# Patient Record
Sex: Female | Born: 1975
Health system: Southern US, Community
[De-identification: ages and names within clinical notes are randomized; demographics above are authoritative.]

---

## 2020-08-06 ENCOUNTER — Other Ambulatory Visit: Payer: Self-pay

## 2020-08-06 ENCOUNTER — Ambulatory Visit (INDEPENDENT_AMBULATORY_CARE_PROVIDER_SITE_OTHER): Payer: 59

## 2020-08-06 ENCOUNTER — Ambulatory Visit (HOSPITAL_COMMUNITY)
Admission: EM | Admit: 2020-08-06 | Discharge: 2020-08-06 | Disposition: A | Discharge status: Home / Self Care | Payer: 59 | Attending: Urgent Care | Admitting: Urgent Care

## 2020-08-06 ENCOUNTER — Encounter (HOSPITAL_COMMUNITY): Payer: Self-pay | Admitting: Urgent Care

## 2020-08-06 DIAGNOSIS — M542 Cervicalgia: Secondary | ICD-10-CM

## 2020-08-06 DIAGNOSIS — M62838 Other muscle spasm: Secondary | ICD-10-CM

## 2020-08-06 MED ORDER — TIZANIDINE HCL 4 MG PO TABS: 4.0000 mg | ORAL_TABLET | Freq: Every day | ORAL | 0 refills | Status: DC

## 2020-08-06 MED ORDER — NAPROXEN 500 MG PO TABS: 500.0000 mg | ORAL_TABLET | Freq: Two times a day (BID) | ORAL | 0 refills | Status: DC

## 2020-08-06 NOTE — ED Triage Notes (Signed)
Pt reports for past 2 weeks her neck has hurt and pain radiates to bil shoulders

## 2020-08-06 NOTE — ED Provider Notes (Signed)
[facility]   MRN: [id number] DOB: 01/17/1976  Subjective:   Christine Riley is a 45 y.o. female presenting for 2-week history of persistent and worsening neck pain that radiates into either side of her trapezius muscles.  Symptoms are moderate to severe, occur randomly and are worse with use of her neck.  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  Denies history of neck issues, arthritis, weakness, numbness or tingling, headaches.  Patient is otherwise healthy.  She is not currently taking any medications and has no known food or drug allergies.  Denies past medical and surgical history.  History reviewed. No pertinent family history.  Social History   Tobacco Use  . Smoking status: Never Smoker  . Smokeless tobacco: Never Used    ROS   Objective:   Vitals: BP 121/74 (BP Location: Right Arm)   Pulse 86   Temp 98.3 F (36.8 C) (Oral)   Resp 16   LMP 07/23/2020   SpO2 98%   Physical Exam Constitutional:      General: She is not in acute distress.    Appearance: Normal appearance. She is well-developed. She is not ill-appearing, toxic-appearing or diaphoretic.  HENT:     Head: Normocephalic and atraumatic.     Nose: Nose normal.     Mouth/Throat:     Mouth: Mucous membranes are moist.     Pharynx: Oropharynx is clear.  Eyes:     General: No scleral icterus.       Right eye: No discharge.        Left eye: No discharge.     Extraocular Movements: Extraocular movements intact.     Conjunctiva/sclera: Conjunctivae normal.     Pupils: Pupils are equal, round, and reactive to light.  Cardiovascular:     Rate and Rhythm: Normal rate.  Pulmonary:     Effort: Pulmonary effort is normal.  Musculoskeletal:     Cervical back: Spasms (over paraspinal muscles bilaterally extending into either trapezius) and tenderness  present. No swelling, edema, deformity, erythema, signs of trauma, lacerations, rigidity, torticollis, bony tenderness or crepitus. Pain with movement present. Decreased range of motion (near full ROM).  Skin:    General: Skin is warm and dry.  Neurological:     General: No focal deficit present.     Mental Status: She is alert and oriented to person, place, and time.  Psychiatric:        Mood and Affect: Mood normal.        Behavior: Behavior normal.        Thought Content: Thought content normal.        Judgment: Judgment normal.     DG Cervical Spine Complete  Result Date: 08/06/2020 CLINICAL DATA:  Neck pain. EXAM: CERVICAL SPINE - COMPLETE 4+ VIEW COMPARISON:  None. FINDINGS: There is no evidence of cervical spine fracture or prevertebral soft tissue swelling. Alignment is normal. No significant disc space narrowing or proliferative disease identified. No other significant bone abnormalities are identified. IMPRESSION: Negative cervical spine radiographs. Electronically Signed   By: Irish Lack M.D.   On: 08/06/2020 13:09     Assessment and Plan :   PDMP not reviewed this encounter.  1. Neck pain   2. Muscle spasms of neck      X-ray negative.  Recommended conservative management with naproxen, tizanidine.  Counseled on back and neck care. Counseled patient on potential for adverse effects with medications prescribed/recommended today, ER and return-to-clinic precautions discussed, patient verbalized understanding.    Wallis Bamberg, PA-C 08/06/20 1331

## 2021-06-15 ENCOUNTER — Other Ambulatory Visit: Payer: Self-pay

## 2021-06-15 ENCOUNTER — Ambulatory Visit
Admission: EM | Admit: 2021-06-15 | Discharge: 2021-06-15 | Disposition: A | Discharge status: Home / Self Care | Payer: 59 | Attending: Emergency Medicine | Admitting: Emergency Medicine

## 2021-06-15 DIAGNOSIS — L0201 Cutaneous abscess of face: Secondary | ICD-10-CM | POA: Diagnosis not present

## 2021-06-15 MED ORDER — SULFAMETHOXAZOLE-TRIMETHOPRIM 800-160 MG PO TABS: 2.0000 | ORAL_TABLET | Freq: Two times a day (BID) | ORAL | 0 refills | Status: AC

## 2021-06-15 NOTE — ED Triage Notes (Signed)
Pt reports having knot to her forehead that began Monday. She states that the area is painful. The area is reddened and inflamed.

## 2021-06-15 NOTE — Discharge Instructions (Addendum)
To treat the abscess in your face, I recommend that you begin Bactrim, please take 2 tablets twice daily for the next 7 days.  Please follow-up in 5 days if you have not seen significant improvement of the bump on your forehead and the bumps beneath your chin.  As we discussed, please do not attempt to manipulate the lesion or encourage it to drain in any way as this can leave scarring.  You may notice that as the lesion shrinks that the skin become flaky or dry appearing, this is normal because the stretched out skin will need to die and fall away.  Please keep the area clean and dry, apply moisturizer no more than twice daily.

## 2021-06-15 NOTE — ED Provider Notes (Signed)
[facility]    CSN: [id number] Arrival date & time: 06/15/21  1443    HISTORY  No chief complaint on file.  HPI Christine Riley is a 45 y.o. female. Pt reports having a lump on her forehead in between her eye brows that appeared Monday and has been gradually enlarging. She states that the area is painful to touch, red, feels inflamed.  Patient states she has not attempted to manipulate the area, has not applied any medication to the area.  Patient states she also noticed this morning that she had a lump under her chin that is also tender to touch and noticed that both sides of her neck right behind her ears are also swollen and painful.  The history is provided by the patient.  History reviewed. No pertinent past medical history. There are no problems to display for this patient.  History reviewed. No pertinent surgical history. OB History   No obstetric history on file.    Home Medications    Prior to Admission medications   Medication Sig Start Date End Date Taking? Authorizing Provider  sulfamethoxazole-trimethoprim (BACTRIM DS) 800-160 MG tablet Take 2 tablets by mouth 2 (two) times daily for 7 days. 06/15/21 06/22/21 Yes Theadora Rama Scales, PA-C   Family History History reviewed. No pertinent family history. Social History Social History   Tobacco Use   Smoking status: Never   Smokeless tobacco: Never  Substance Use Topics   Alcohol use: Never   Drug use: Never   Allergies   Patient has no known allergies.  Review of Systems Review of Systems Pertinent findings noted in history of present illness.   Physical Exam Triage Vital Signs ED Triage Vitals  Enc Vitals Group     BP 05/12/21 0827 (!) 147/82     Pulse Rate 05/12/21 0827 72     Resp 05/12/21 0827 18     Temp 05/12/21 0827 98.3 F (36.8 C)     Temp Source 05/12/21 0827 Oral     SpO2 05/12/21 0827 98 %     Weight --      Height --      Head Circumference --      Peak Flow --       Pain Score 05/12/21 0826 5     Pain Loc --      Pain Edu? --      Excl. in GC? --   No data found.  Updated Vital Signs BP (!) 148/88 (BP Location: Right Arm)   Pulse 85   Temp 98.7 F (37.1 C) (Oral)   Resp 18   LMP 06/11/2021 (Approximate)   SpO2 99%   Physical Exam Vitals and nursing note reviewed.  Constitutional:      General: She is not in acute distress.    Appearance: Normal appearance. She is not ill-appearing.  HENT:     Head: Normocephalic and atraumatic.  Eyes:     General: Lids are normal.        Right eye: No discharge.        Left eye: No discharge.     Extraocular Movements: Extraocular movements intact.     Conjunctiva/sclera: Conjunctivae normal.     Right eye: Right conjunctiva is not injected.     Left eye: Left conjunctiva is not injected.  Neck:     Trachea: Trachea and phonation normal.     Comments: Submandibular gland enlarged, tender to palpation Cardiovascular:     Rate and Rhythm:  Normal rate and regular rhythm.     Pulses: Normal pulses.     Heart sounds: Normal heart sounds. No murmur heard.   No friction rub. No gallop.  Pulmonary:     Effort: Pulmonary effort is normal. No accessory muscle usage, prolonged expiration or respiratory distress.     Breath sounds: Normal breath sounds. No stridor, decreased air movement or transmitted upper airway sounds. No decreased breath sounds, wheezing, rhonchi or rales.  Chest:     Chest wall: No tenderness.  Musculoskeletal:        General: Normal range of motion.     Cervical back: Normal range of motion and neck supple. Normal range of motion.  Lymphadenopathy:     Cervical: Cervical adenopathy present.     Right cervical: Superficial cervical adenopathy and posterior cervical adenopathy present.     Left cervical: Superficial cervical adenopathy and posterior cervical adenopathy present.  Skin:    General: Skin is warm and dry.     Findings: No erythema or rash.  Neurological:     General:  No focal deficit present.     Mental Status: She is alert and oriented to person, place, and time.  Psychiatric:        Mood and Affect: Mood normal.        Behavior: Behavior normal.    Visual Acuity Right Eye Distance:   Left Eye Distance:   Bilateral Distance:    Right Eye Near:   Left Eye Near:    Bilateral Near:     UC Couse / Diagnostics / Procedures:    EKG  Radiology No results found.  Procedures Procedures (including critical care time)  UC Diagnoses / Final Clinical Impressions(s)   I have reviewed the triage vital signs and the nursing notes.  Pertinent labs & imaging results that were available during my care of the patient were reviewed by me and considered in my medical decision making (see chart for details).    Final diagnoses:  Acute abscess of face   Abscess of face.  We will treat with Bactrim double strength, 2 tablets twice daily for 7 days.  Return precautions advised.  Patient advised not to manipulate the area to reduce the risk of scarring.   Disposition Upon Discharge:  Condition: stable for discharge home Home: take medications as prescribed; routine discharge instructions as discussed; follow up as advised.  Patient presented with an acute illness with associated systemic symptoms and significant discomfort requiring urgent management. In my opinion, this is a condition that a prudent lay person (someone who possesses an average knowledge of health and medicine) may potentially expect to result in complications if not addressed urgently such as respiratory distress, impairment of bodily function or dysfunction of bodily organs.   Routine symptom specific, illness specific and/or disease specific instructions were discussed with the patient and/or caregiver at length.   As such, the patient has been evaluated and assessed, work-up was performed and treatment was provided in alignment with urgent care protocols and evidence based medicine.   Patient/parent/caregiver has been advised that the patient may require follow up for further testing and treatment if the symptoms continue in spite of treatment, as clinically indicated and appropriate.  If the patient was tested for COVID-19, Influenza and/or RSV, then the patient/parent/guardian was advised to isolate at home pending the results of his/her diagnostic coronavirus test and potentially longer if they're positive. I have also advised pt that if his/her COVID-19 test returns positive, it's  recommended to self-isolate for at least 10 days after symptoms first appeared AND until fever-free for 24 hours without fever reducer AND other symptoms have improved or resolved. Discussed self-isolation recommendations as well as instructions for household member/close contacts as per the [facility] and [place] DHHS, and also gave patient the COVID packet with this information.  Patient/parent/caregiver has been advised to return to the [facility] or PCP in 3-5 days if no better; to PCP or the Emergency Department if new signs and symptoms develop, or if the current signs or symptoms continue to change or worsen for further workup, evaluation and treatment as clinically indicated and appropriate  The patient will follow up with their current PCP if and as advised. If the patient does not currently have a PCP we will assist them in obtaining one.   The patient may need specialty follow up if the symptoms continue, in spite of conservative treatment and management, for further workup, evaluation, consultation and treatment as clinically indicated and appropriate.   Patient/parent/caregiver verbalized understanding and agreement of plan as discussed.  All questions were addressed during visit.  Please see discharge instructions below for further details of plan.  ED Prescriptions     Medication Sig Dispense Auth. Provider   sulfamethoxazole-trimethoprim (BACTRIM DS) 800-160 MG tablet Take 2 tablets by mouth 2 (two)  times daily for 7 days. 28 tablet Theadora Rama Scales, PA-C      PDMP not reviewed this encounter.  Pending results:  Labs Reviewed - No data to display  Medications Ordered in UC: Medications - No data to display  Discharge Instructions:   Discharge Instructions      To treat the abscess in your face, I recommend that you begin Bactrim, please take 2 tablets twice daily for the next 7 days.  Please follow-up in 5 days if you have not seen significant improvement of the bump on your forehead and the bumps beneath your chin.  As we discussed, please do not attempt to manipulate the lesion or encourage it to drain in any way as this can leave scarring.  You may notice that as the lesion shrinks that the skin become flaky or dry appearing, this is normal because the stretched out skin will need to die and fall away.  Please keep the area clean and dry, apply moisturizer no more than twice daily.         Theadora Rama Scales, PA-C 06/15/21 1236

## 2021-06-20 ENCOUNTER — Encounter (HOSPITAL_BASED_OUTPATIENT_CLINIC_OR_DEPARTMENT_OTHER): Payer: Self-pay

## 2021-06-20 ENCOUNTER — Ambulatory Visit
Admission: EM | Admit: 2021-06-20 | Discharge: 2021-06-20 | Disposition: A | Discharge status: Home / Self Care | Payer: 59

## 2021-06-20 ENCOUNTER — Emergency Department (HOSPITAL_BASED_OUTPATIENT_CLINIC_OR_DEPARTMENT_OTHER): Payer: 59

## 2021-06-20 ENCOUNTER — Emergency Department (HOSPITAL_BASED_OUTPATIENT_CLINIC_OR_DEPARTMENT_OTHER)
Admission: EM | Admit: 2021-06-20 | Discharge: 2021-06-20 | Disposition: A | Discharge status: Home / Self Care | Payer: 59 | Attending: Emergency Medicine | Admitting: Emergency Medicine

## 2021-06-20 ENCOUNTER — Other Ambulatory Visit: Payer: Self-pay

## 2021-06-20 DIAGNOSIS — L03211 Cellulitis of face: Secondary | ICD-10-CM | POA: Diagnosis not present

## 2021-06-20 DIAGNOSIS — R220 Localized swelling, mass and lump, head: Secondary | ICD-10-CM | POA: Diagnosis present

## 2021-06-20 DIAGNOSIS — H5711 Ocular pain, right eye: Secondary | ICD-10-CM | POA: Insufficient documentation

## 2021-06-20 LAB — CBC WITH DIFFERENTIAL/PLATELET
Abs Immature Granulocytes: 0.01 10*3/uL (ref 0.00–0.07)
Basophils Absolute: 0 10*3/uL (ref 0.0–0.1)
Basophils Relative: 1 %
Eosinophils Absolute: 0.1 10*3/uL (ref 0.0–0.5)
Eosinophils Relative: 3 %
HCT: 36.7 % (ref 36.0–46.0)
Hemoglobin: 11.9 g/dL — ABNORMAL LOW (ref 12.0–15.0)
Immature Granulocytes: 0 %
Lymphocytes Relative: 47 %
Lymphs Abs: 2.1 10*3/uL (ref 0.7–4.0)
MCH: 27.5 pg (ref 26.0–34.0)
MCHC: 32.4 g/dL (ref 30.0–36.0)
MCV: 85 fL (ref 80.0–100.0)
Monocytes Absolute: 0.5 10*3/uL (ref 0.1–1.0)
Monocytes Relative: 12 %
Neutro Abs: 1.6 10*3/uL — ABNORMAL LOW (ref 1.7–7.7)
Neutrophils Relative %: 37 %
Platelets: 316 10*3/uL (ref 150–400)
RBC: 4.32 MIL/uL (ref 3.87–5.11)
RDW: 13.8 % (ref 11.5–15.5)
WBC: 4.4 10*3/uL (ref 4.0–10.5)
nRBC: 0 % (ref 0.0–0.2)

## 2021-06-20 LAB — COMPREHENSIVE METABOLIC PANEL
ALT: 18 U/L (ref 0–44)
AST: 25 U/L (ref 15–41)
Albumin: 4.1 g/dL (ref 3.5–5.0)
Alkaline Phosphatase: 55 U/L (ref 38–126)
Anion gap: 7 (ref 5–15)
BUN: 11 mg/dL (ref 6–20)
CO2: 21 mmol/L — ABNORMAL LOW (ref 22–32)
Calcium: 9 mg/dL (ref 8.9–10.3)
Chloride: 106 mmol/L (ref 98–111)
Creatinine, Ser: 0.87 mg/dL (ref 0.44–1.00)
GFR, Estimated: >60 mL/min (ref >60)
Glucose, Bld: 96 mg/dL (ref 70–99)
Potassium: 4.5 mmol/L (ref 3.5–5.1)
Sodium: 134 mmol/L — ABNORMAL LOW (ref 135–145)
Total Bilirubin: 0.7 mg/dL (ref 0.3–1.2)
Total Protein: 7.9 g/dL (ref 6.5–8.1)

## 2021-06-20 LAB — HCG, SERUM, QUALITATIVE: Preg, Serum: NEGATIVE

## 2021-06-20 MED ORDER — CLINDAMYCIN PHOSPHATE 600 MG/50ML IV SOLN
600.0000 mg | Freq: Once | INTRAVENOUS | Status: AC
  Administered 2021-06-20: 600 mg via INTRAVENOUS
  Filled 2021-06-20: qty 50

## 2021-06-20 MED ORDER — ACETAMINOPHEN 325 MG PO TABS
650.0000 mg | ORAL_TABLET | Freq: Once | ORAL | Status: AC
  Administered 2021-06-20: 650 mg via ORAL
  Filled 2021-06-20: qty 2

## 2021-06-20 MED ORDER — CEPHALEXIN 500 MG PO CAPS: 500.0000 mg | ORAL_CAPSULE | Freq: Four times a day (QID) | ORAL | 0 refills | Status: AC

## 2021-06-20 MED ORDER — IOHEXOL 300 MG/ML  SOLN
100.0000 mL | Freq: Once | INTRAMUSCULAR | Status: AC | PRN
  Administered 2021-06-20: 80 mL via INTRAVENOUS

## 2021-06-20 NOTE — ED Notes (Signed)
Patient is being discharged from the Urgent Care and sent to the Emergency Department via POV . Per [facility], patient is in need of higher level of care due to incomplete facial cellulitis unresolved after antibiotics. Patient is aware and verbalizes understanding of plan of care.  Vitals:   06/20/21 0949  BP: 124/84  Pulse: 86  Resp: 18  Temp: 98.2 F (36.8 C)  SpO2: 98%

## 2021-06-20 NOTE — ED Notes (Signed)
ED Provider at bedside. 

## 2021-06-20 NOTE — ED Notes (Signed)
Patient transported to CT 

## 2021-06-20 NOTE — ED Triage Notes (Signed)
Pt has abscess to her forehead that has improved since her last visit. Patient states she has noticed some generalized facial swelling. Patient does not have SOB, airway is patent.

## 2021-06-20 NOTE — Discharge Instructions (Signed)
Please go to the emergency room now for evaluation of the unresolved infection on your face.

## 2021-06-20 NOTE — Discharge Instructions (Addendum)
You were seen in the ER today for your facial skin infection.  Your work-up was reassuring.  There is no involvement of your orbits or your eyes and your infection.  You been prescribed a new antibiotic to take 4 times a day for the next week.  Please take it as prescribed for the entire course.  You may use Tylenol and Motrin as needed for your discomfort.  Return to the ER if you develop any worsening facial swelling, puslike drainage from the area, blurry or double vision in the eyes, or any other new severe symptoms.

## 2021-06-20 NOTE — ED Provider Notes (Signed)
[facility] Provider Note   CSN: [id number] Arrival date & time: 06/20/21  1134     History Chief Complaint  Patient presents with   Facial Swelling    Christine Riley is a 45 y.o. female who presents on recommendation from urgent care.  Patient was diagnosed with facial cellulitis 1 week ago and has been treated with Bactrim at home twice daily.  Follow-up with urgent care today due to very little improvement in her symptoms as well as new pain behind her right eye.  Was directed to the emergency department.  Primarily patient has endorsed swelling between the eyes, some purulent drainage with administration of warm compresses in the evenings.  Denies any fevers or chills at home.  Does endorse photosensitivity in the right eye.  I have personally reviewed this patient's medical records.  She does not carry any medical diagnoses and is not on any medications daily aside from current course of Bactrim.    HPI     History reviewed. No pertinent past medical history.  There are no problems to display for this patient.   History reviewed. No pertinent surgical history.   OB History   No obstetric history on file.     No family history on file.  Social History   Tobacco Use   Smoking status: Never   Smokeless tobacco: Never  Vaping Use   Vaping Use: Never used  Substance Use Topics   Alcohol use: Never   Drug use: Never    Home Medications Prior to Admission medications   Medication Sig Start Date End Date Taking? Authorizing Provider  cephALEXin (KEFLEX) 500 MG capsule Take 1 capsule (500 mg total) by mouth 4 (four) times daily for 7 days. 06/20/21 06/27/21 Yes [place], Eugene Garnet R, PA-C  sulfamethoxazole-trimethoprim (BACTRIM DS) 800-160 MG tablet Take 2 tablets by mouth 2 (two) times daily for 7 days. 06/15/21 06/22/21  Lynden Oxford Scales, PA-C    Allergies    Patient has no known allergies.  Review of Systems   Review of  Systems  Constitutional: Negative.   HENT:  Positive for facial swelling.   Eyes:  Positive for photophobia and pain. Negative for discharge, redness and visual disturbance.  Respiratory: Negative.    Cardiovascular: Negative.   Gastrointestinal: Negative.   Genitourinary: Negative.   Musculoskeletal: Negative.   Skin: Negative.   Neurological:  Positive for headaches. Negative for dizziness and light-headedness.   Physical Exam Updated Vital Signs BP 106/73   Pulse 74   Temp 98.6 F (37 C) (Oral)   Resp 16   Ht 5\' 6"  (1.676 m)   Wt 88.5 kg   LMP 06/11/2021 (Approximate)   SpO2 100%   BMI 31.47 kg/m   Physical Exam Vitals and nursing note reviewed.  Constitutional:      Appearance: She is not ill-appearing or toxic-appearing.  HENT:     Head: Atraumatic.      Nose: Nose normal.     Mouth/Throat:     Mouth: Mucous membranes are moist.     Pharynx: Oropharynx is clear. Uvula midline. No oropharyngeal exudate or posterior oropharyngeal erythema.  Eyes:     General: Lids are normal. Vision grossly intact.        Right eye: No discharge.        Left eye: No discharge.     Extraocular Movements: Extraocular movements intact.     Conjunctiva/sclera: Conjunctivae normal.     Pupils: Pupils are equal, round,  and reactive to light.     Comments: Pain in the right eye with extraocular movements.  Photosensitivity in the right eye.  Neck:     Trachea: Trachea and phonation normal.  Cardiovascular:     Rate and Rhythm: Normal rate and regular rhythm.     Pulses: Normal pulses.     Heart sounds: Normal heart sounds. No murmur heard. Pulmonary:     Effort: Pulmonary effort is normal. No tachypnea, bradypnea, accessory muscle usage, prolonged expiration or respiratory distress.     Breath sounds: Normal breath sounds. No wheezing or rales.  Chest:     Chest wall: No mass, lacerations, deformity, swelling, tenderness, crepitus or edema.  Abdominal:     General: Bowel sounds  are normal. There is no distension.     Palpations: Abdomen is soft.     Tenderness: There is no abdominal tenderness. There is no guarding or rebound.  Musculoskeletal:        General: No deformity.     Cervical back: Normal range of motion and neck supple. No edema, rigidity or crepitus. No pain with movement, spinous process tenderness or muscular tenderness.     Right lower leg: No edema.     Left lower leg: No edema.  Lymphadenopathy:     Cervical: No cervical adenopathy.  Skin:    General: Skin is warm and dry.     Capillary Refill: Capillary refill takes less than 2 seconds.     Findings: Erythema present.  Neurological:     General: No focal deficit present.     Mental Status: She is alert and oriented to person, place, and time. Mental status is at baseline.     Gait: Gait is intact.  Psychiatric:        Mood and Affect: Mood normal.    ED Results / Procedures / Treatments   Labs (all labs ordered are listed, but only abnormal results are displayed) Labs Reviewed  COMPREHENSIVE METABOLIC PANEL - Abnormal; Notable for the following components:      Result Value   Sodium 134 (*)    CO2 21 (*)    All other components within normal limits  CBC WITH DIFFERENTIAL/PLATELET - Abnormal; Notable for the following components:   Hemoglobin 11.9 (*)    Neutro Abs 1.6 (*)    All other components within normal limits  HCG, SERUM, QUALITATIVE  CBC WITH DIFFERENTIAL/PLATELET    EKG None  Radiology CT Orbits W Contrast  Result Date: 06/20/2021 CLINICAL DATA:  Cellulitis in the mid forehead, between the eyebrows, pain with extraocular movements in right eye. And EXAM: CT ORBITS WITH CONTRAST TECHNIQUE: Multidetector CT images was performed according to the standard protocol following intravenous contrast administration. CONTRAST:  40mL OMNIPAQUE IOHEXOL 300 MG/ML  SOLN COMPARISON:  No pertinent prior exam. FINDINGS: Orbits: No orbital mass or evidence of inflammation. Normal  appearance of the globes, optic nerve-sheath complexes, extraocular muscles, orbital fat and lacrimal glands. Visible paranasal sinuses: Minimal mucus in the right lateral sphenoid sinus. Otherwise clear. Soft tissues: No large hematoma, laceration, or significant inflammation. Osseous: No fracture or aggressive lesion. Limited intracranial: No acute or significant finding. IMPRESSION: No evidence of preseptal cellulitis. Normal appearance of the orbits. Electronically Signed   By: Merilyn Baba M.D.   On: 06/20/2021 13:00    Procedures Procedures   Medications Ordered in ED Medications  acetaminophen (TYLENOL) tablet 650 mg (650 mg Oral Given 06/20/21 1315)  iohexol (OMNIPAQUE) 300 MG/ML solution 100 mL (  80 mLs Intravenous Contrast Given 06/20/21 1236)  clindamycin (CLEOCIN) IVPB 600 mg (0 mg Intravenous Stopped 06/20/21 1525)    ED Course  I have reviewed the triage vital signs and the nursing notes.  Pertinent labs & imaging results that were available during my care of the patient were reviewed by me and considered in my medical decision making (see chart for details).    MDM Rules/Calculators/A&P                         45 year old female with diagnosis of facial cellulitis from urgent care who presents with persistence of symptoms despite antibiotics.  Differential diagnosis includes but limited to periorbital cellulitis, orbital cellulitis, facial cellulitis, erysipelas, soft tissue edema, sebaceous cyst.  Vital signs are normal and intake.  Cardiopulmonary dam is normal, abdominal exam is benign.  Skin examination did reveal induration, erythema, tenderness palpation of the forehead between the 2 eyebrows without periorbital edema, erythema, or induration.  Pain in the right eye with EOMs without conjunctival injection or discharge.  PERRL.  CBC  with mild anemia with hemoglobin 11.9.  CMP with mild hyponatremia of 134.  CT of the orbits was obtained which was negative for preseptal or  orbital cellulitis.  Favor diagnosis of facial cellulitis primarily affecting the frontal area.  As patient has completed course of Bactrim, recommendation for antibiotics was discussed with ED pharmacist who recommends proceeding with cephalexin 4 times a day for the next week.  I appreciate his collaboration in the care of this patient.  Joanna voiced understanding of her medical evaluation and treatment plan.  For questions answered to her expressed satisfaction.  Recommend close outpatient follow-up with her PCP and return to the ED with any severe symptoms.  Return precautions were given.  Patient is well-appearing, stable, and appropriate for discharge at this time.  This chart was dictated using voice recognition software, Dragon. Despite the best efforts of this provider to proofread and correct errors, errors may still occur which can change documentation meaning.  Final Clinical Impression(s) / ED Diagnoses Final diagnoses:  Facial cellulitis    Rx / DC Orders ED Discharge Orders          Ordered    cephALEXin (KEFLEX) 500 MG capsule  4 times daily        06/20/21 1440             Lessa Huge, Eugene Gavia, PA-C 06/20/21 1600    Terrilee Files, MD 06/20/21 1805

## 2021-06-20 NOTE — ED Triage Notes (Signed)
Pt dx with cellulitis last week-reports little improvement with abx-states she was advised by UC to come to ED-NAD-steady gait

## 2021-06-20 NOTE — ED Provider Notes (Signed)
Patient was seen here in urgent care 5 days ago for abscess on face, patient is nearly finished with oral antibiotics, has not had meaningful improvement and now experiencing headaches secondary to pain and pressure as well as a new area of possible infection to the right of the initial infection.  Patient was advised to go the emergency room for CT of head to evaluate for deep pocket of infection that needs incision and drainage, possible parenteral antibiotics.   Theadora Rama Scales, PA-C 06/20/21 1054

## 2021-06-26 ENCOUNTER — Other Ambulatory Visit: Payer: Self-pay

## 2021-06-26 ENCOUNTER — Ambulatory Visit
Admission: EM | Admit: 2021-06-26 | Discharge: 2021-06-26 | Disposition: A | Discharge status: Home / Self Care | Payer: 59

## 2021-06-26 NOTE — ED Triage Notes (Signed)
Pt states the abx the she was prescribed makes her dizzy and is requesting new medication and re evaluation.

## 2022-01-26 IMAGING — CT CT ORBITS W/ CM
3 series · 15 of 47 positions shown, 18 images · IV contrast (Omnipaque)
Comparison: No pertinent prior exam.

CLINICAL DATA: Cellulitis in the mid forehead, between the
eyebrows, pain with extraocular movements in right eye. And

EXAM:
CT ORBITS WITH CONTRAST
TECHNIQUE: Multidetector CT images was performed according to the standard
protocol following intravenous contrast administration.
CONTRAST:  80mL OMNIPAQUE IOHEXOL 300 MG/ML  SOLN

[Series 3: orbits 2.0 h30s st · axial · 0.31mm/px · z∈[-129,-31]mm · 9 of 57 slices shown, 12 images]
[im 4/57  brain]
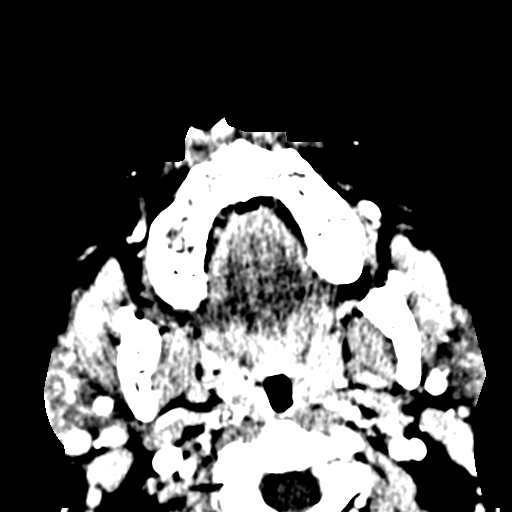
[im 4/57  bone]
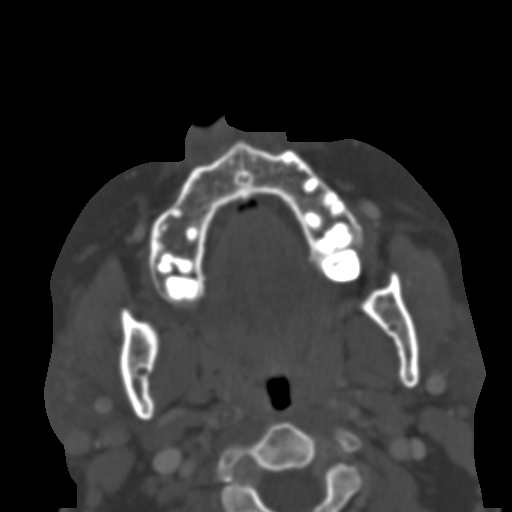
[im 10/57  bone]
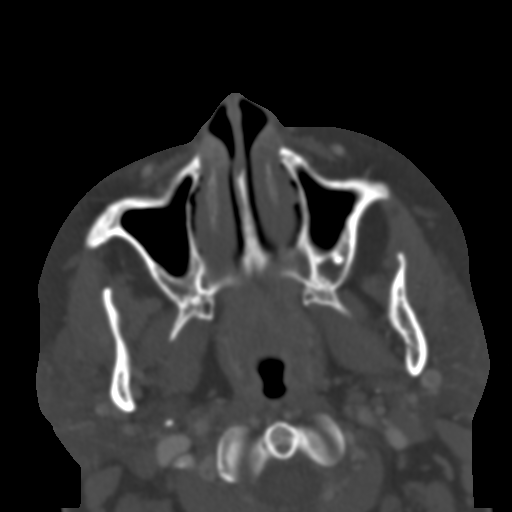
[im 16/57  bone]
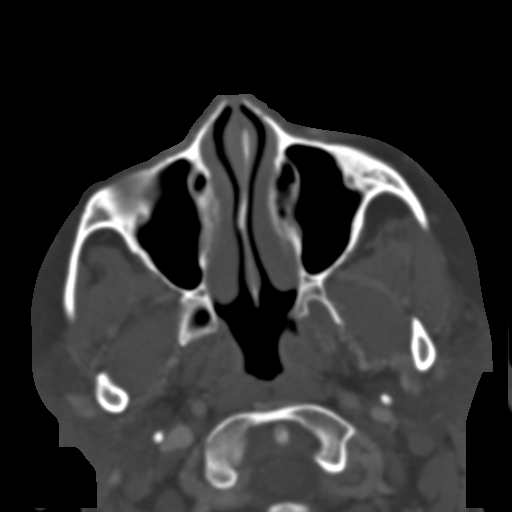
[im 22/57  bone]
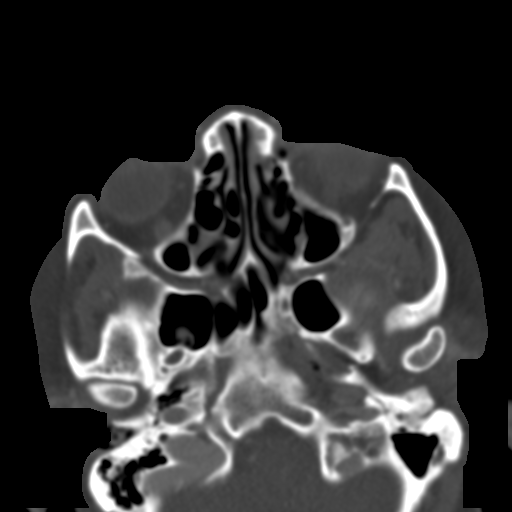
[im 29/57  brain]
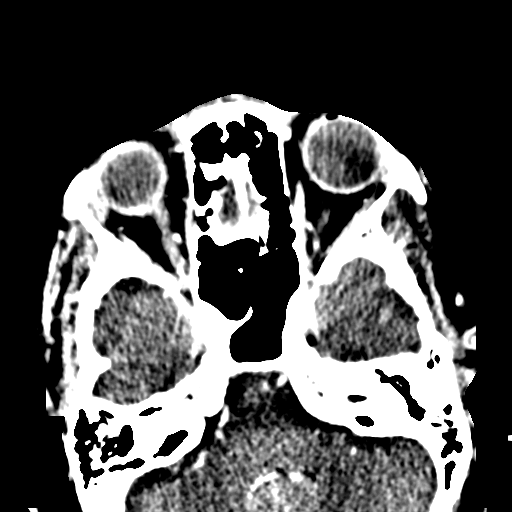
[im 29/57  bone]
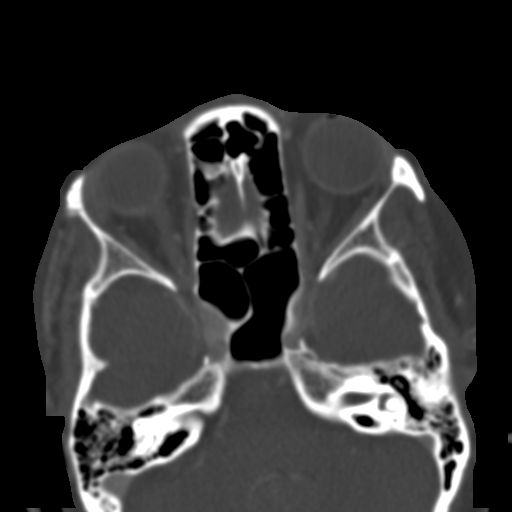
[im 35/57  bone]
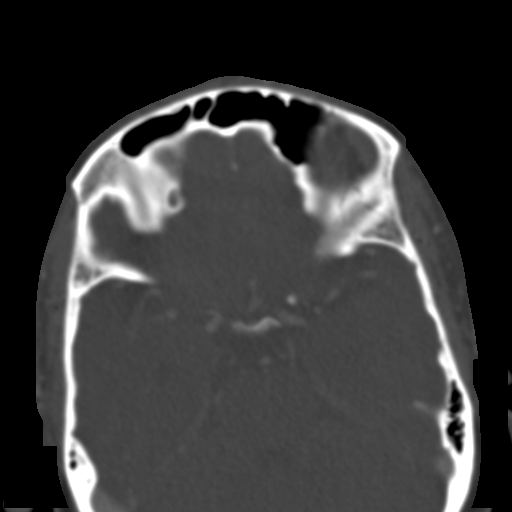
[im 41/57  bone]
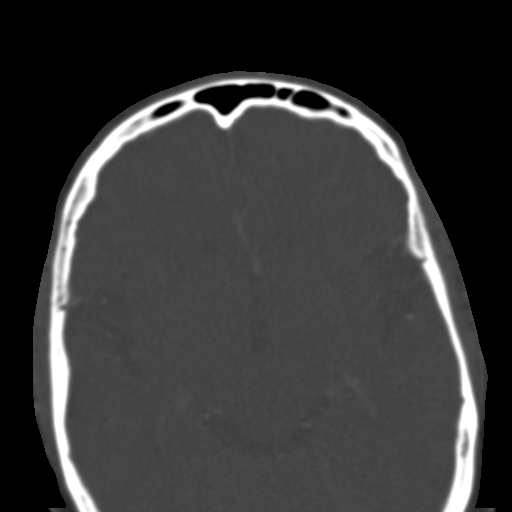
[im 47/57  bone]
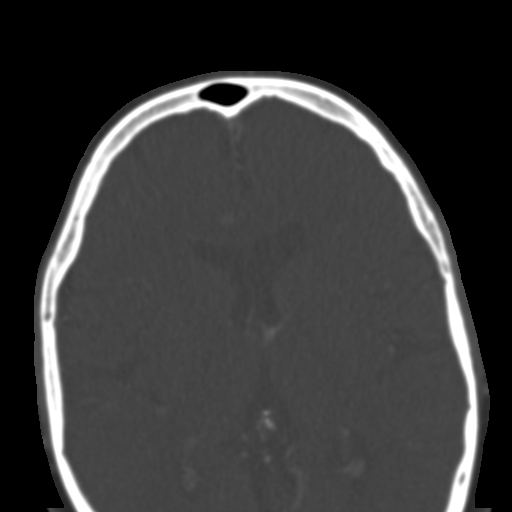
[im 53/57  brain]
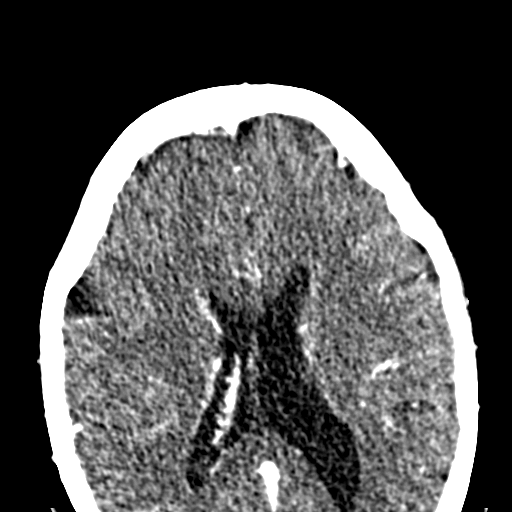
[im 53/57  bone]
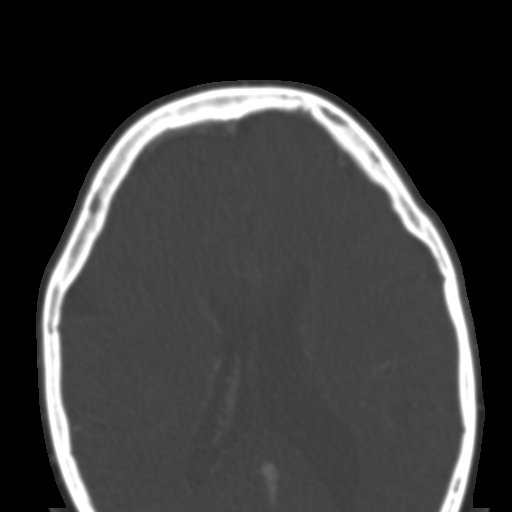

[Series 8: orbits 2.0 coronal st · coronal · 0.24mm/px · 3 of 67 slices shown]
[im 23/67  bone]
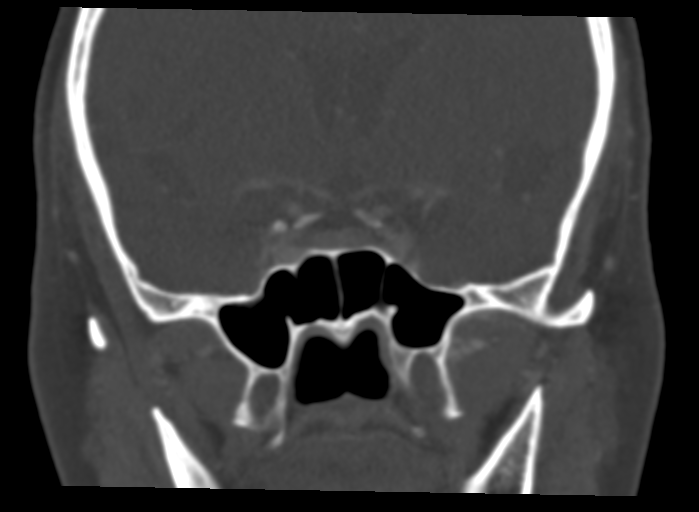
[im 30/67  bone]
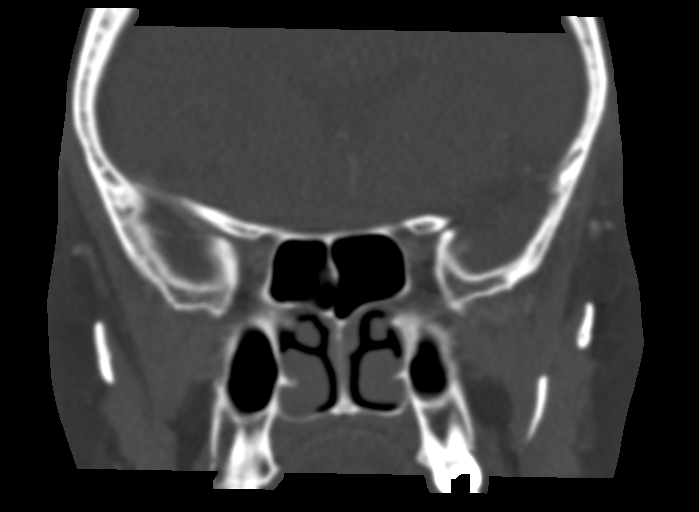
[im 37/67  bone]
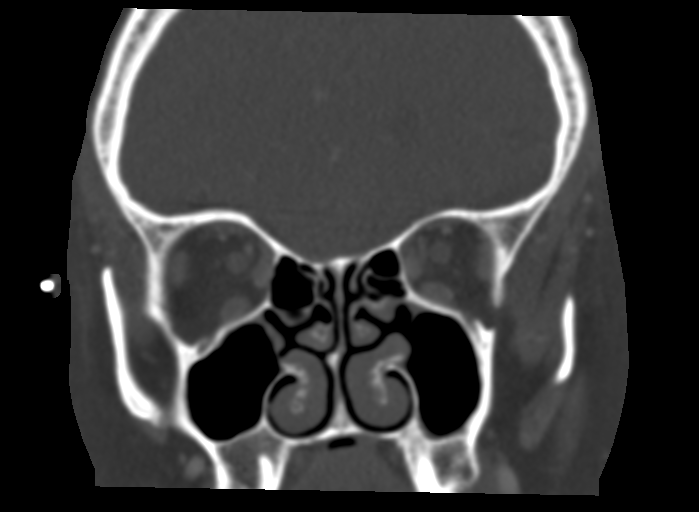

[Series 9: orbits 2.0 sagittal st · sagittal · 0.24mm/px · 3 of 83 slices shown]
[im 28/83  bone]
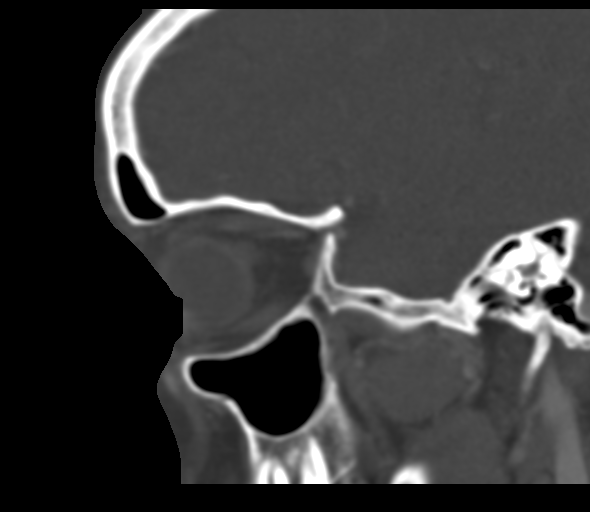
[im 42/83  bone]
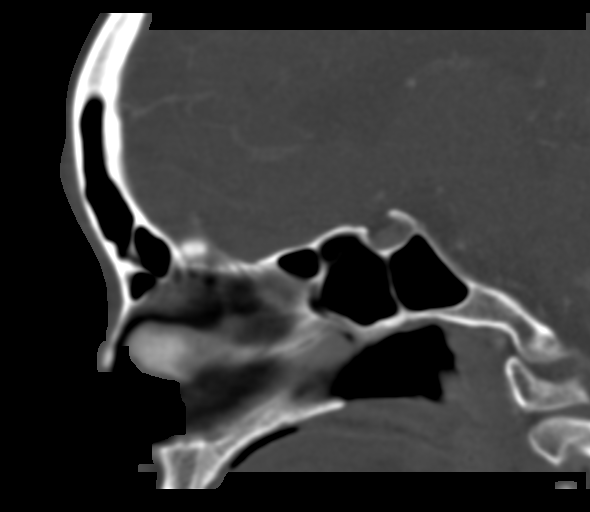
[im 55/83  bone]
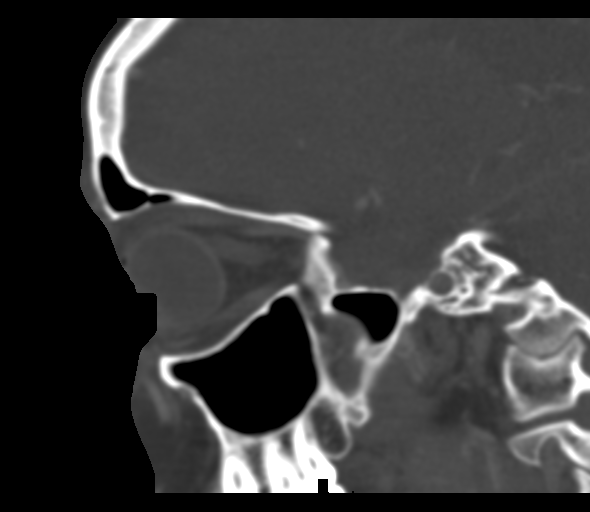

[15 of 47 positions shown; findings below may reference images not displayed]

FINDINGS: Orbits: No orbital mass or evidence of inflammation. Normal
appearance of the globes, optic nerve-sheath complexes, extraocular
muscles, orbital fat and lacrimal glands.

Visible paranasal sinuses: Minimal mucus in the right lateral
sphenoid sinus. Otherwise clear.

Soft tissues: No large hematoma, laceration, or significant
inflammation.

Osseous: No fracture or aggressive lesion.

Limited intracranial: No acute or significant finding.
IMPRESSION: No evidence of preseptal cellulitis. Normal appearance of the
orbits.

## 2022-06-15 DIAGNOSIS — H5213 Myopia, bilateral: Secondary | ICD-10-CM | POA: Diagnosis not present

## 2022-10-26 ENCOUNTER — Ambulatory Visit
Admission: EM | Admit: 2022-10-26 | Discharge: 2022-10-26 | Disposition: A | Discharge status: Home / Self Care | Payer: Commercial Managed Care - PPO | Attending: Urgent Care | Admitting: Urgent Care

## 2022-10-26 DIAGNOSIS — J4520 Mild intermittent asthma, uncomplicated: Secondary | ICD-10-CM | POA: Diagnosis not present

## 2022-10-26 DIAGNOSIS — R062 Wheezing: Secondary | ICD-10-CM | POA: Diagnosis not present

## 2022-10-26 MED ORDER — METHYLPREDNISOLONE ACETATE 80 MG/ML IJ SUSP
40.0000 mg | Freq: Once | INTRAMUSCULAR | Status: AC
  Administered 2022-10-26: 40 mg via INTRAMUSCULAR

## 2022-10-26 MED ORDER — ALBUTEROL SULFATE HFA 108 (90 BASE) MCG/ACT IN AERS: 1.0000 | INHALATION_SPRAY | Freq: Four times a day (QID) | RESPIRATORY_TRACT | 0 refills | Status: AC | PRN

## 2022-10-26 MED ORDER — PROMETHAZINE-DM 6.25-15 MG/5ML PO SYRP: 5.0000 mL | ORAL_SOLUTION | Freq: Three times a day (TID) | ORAL | 0 refills | Status: AC | PRN

## 2022-10-26 MED ORDER — CETIRIZINE HCL 10 MG PO TABS: 10.0000 mg | ORAL_TABLET | Freq: Every day | ORAL | 0 refills | Status: AC

## 2022-10-26 MED ORDER — METHYLPREDNISOLONE ACETATE 40 MG/ML IJ SUSP: 40.0000 mg | Freq: Once | INTRAMUSCULAR | Status: DC

## 2022-10-26 NOTE — ED Triage Notes (Signed)
Pt c/o dry and prod cough, chest congestion, wheezing sx started 4/7-NAD-steady gait

## 2022-10-26 NOTE — ED Provider Notes (Signed)
[facility]  Note:  This document was prepared using Conservation officer, historic buildings and may include unintentional dictation errors.  MRN: [id number] DOB: December 18, 1975  Subjective:   Christine Riley is a 47 y.o. female presenting for 5-day history of persistent cough, chest congestion and wheezing.  Has been using pseudoephedrine. Has a history of asthma but hasn't bothered her for years. No fever, chest pain, body aches, sinus congestion, sinus pain, throat pain. No smoking of any kind including cigarettes, cigars, vaping, marijuana use.  Has previously responded with steroid injections.   No current facility-administered medications for this encounter. No current outpatient medications on file.   No Known Allergies  History reviewed. No pertinent past medical history.   History reviewed. No pertinent surgical history.  No family history on file.  Social History   Tobacco Use   Smoking status: Never   Smokeless tobacco: Never  Vaping Use   Vaping Use: Never used  Substance Use Topics   Alcohol use: Never   Drug use: Never    ROS   Objective:   Vitals: BP (!) 141/94 (BP Location: Right Arm)   Pulse (!) 108   Temp 97.9 F (36.6 C) (Oral)   Resp 20   LMP 10/20/2022   SpO2 95%   Pulse recheck was 95bpm-96bpm.   Physical Exam Constitutional:      General: She is not in acute distress.    Appearance: Normal appearance. She is well-developed and normal weight. She is not ill-appearing, toxic-appearing or diaphoretic.  HENT:     Head: Normocephalic and atraumatic.     Right Ear: Tympanic membrane, ear canal and external ear normal. No drainage or tenderness. No middle ear effusion. There is no impacted cerumen. Tympanic membrane is not erythematous or bulging.     Left Ear: Tympanic membrane, ear canal and external ear normal. No drainage or tenderness.  No middle ear effusion. There is no impacted cerumen. Tympanic membrane is not  erythematous or bulging.     Nose: Nose normal. No congestion or rhinorrhea.     Mouth/Throat:     Mouth: Mucous membranes are moist. No oral lesions.     Pharynx: No pharyngeal swelling, oropharyngeal exudate, posterior oropharyngeal erythema or uvula swelling.     Tonsils: No tonsillar exudate or tonsillar abscesses.  Eyes:     General: No scleral icterus.       Right eye: No discharge.        Left eye: No discharge.     Extraocular Movements: Extraocular movements intact.     Right eye: Normal extraocular motion.     Left eye: Normal extraocular motion.     Conjunctiva/sclera: Conjunctivae normal.  Cardiovascular:     Rate and Rhythm: Normal rate and regular rhythm.     Heart sounds: Normal heart sounds. No murmur heard.    No friction rub. No gallop.  Pulmonary:     Effort: Pulmonary effort is normal. No respiratory distress.     Breath sounds: No stridor. No wheezing, rhonchi or rales.  Chest:     Chest wall: No tenderness.  Musculoskeletal:     Cervical back: Normal range of motion and neck supple.  Lymphadenopathy:     Cervical: No cervical adenopathy.  Skin:    General: Skin is warm and dry.  Neurological:     General: No focal deficit present.     Mental Status: She is alert and oriented to person, place, and time.  Psychiatric:  Mood and Affect: Mood normal.        Behavior: Behavior normal.    IM Depomedrol  administered in clinic.   Assessment and Plan :   PDMP not reviewed this encounter.  1. Mild intermittent extrinsic asthma without complication   2. Wheezing     Recommended management of her allergic asthma with IM Depomedrol. Use Zyrtec, albuterol. Deferred imaging given clear cardiopulmonary exam, hemodynamically stable vital signs. Counseled patient on potential for adverse effects with medications prescribed/recommended today, ER and return-to-clinic precautions discussed, patient verbalized understanding.    Wallis Bamberg, [place] 10/26/22  1219

## 2023-02-28 ENCOUNTER — Ambulatory Visit
Admission: EM | Admit: 2023-02-28 | Discharge: 2023-02-28 | Disposition: A | Discharge status: Home / Self Care | Payer: Commercial Managed Care - PPO | Attending: Internal Medicine | Admitting: Internal Medicine

## 2023-02-28 DIAGNOSIS — R319 Hematuria, unspecified: Secondary | ICD-10-CM | POA: Insufficient documentation

## 2023-02-28 DIAGNOSIS — M545 Low back pain, unspecified: Secondary | ICD-10-CM | POA: Diagnosis not present

## 2023-02-28 LAB — POCT URINALYSIS DIP (MANUAL ENTRY)
Bilirubin, UA: NEGATIVE
Glucose, UA: NEGATIVE mg/dL
Ketones, POC UA: NEGATIVE mg/dL
Leukocytes, UA: NEGATIVE
Nitrite, UA: NEGATIVE
Protein Ur, POC: NEGATIVE mg/dL
Spec Grav, UA: 1.015 (ref 1.010–1.025)
Urobilinogen, UA: 0.2 E.U./dL
pH, UA: 5.5 (ref 5.0–8.0)

## 2023-02-28 MED ORDER — NAPROXEN 500 MG PO TABS: 500.0000 mg | ORAL_TABLET | Freq: Two times a day (BID) | ORAL | 0 refills | Status: AC

## 2023-02-28 MED ORDER — KETOROLAC TROMETHAMINE 30 MG/ML IJ SOLN
30.0000 mg | Freq: Once | INTRAMUSCULAR | Status: AC
  Administered 2023-02-28: 30 mg via INTRAMUSCULAR

## 2023-02-28 MED ORDER — CYCLOBENZAPRINE HCL 10 MG PO TABS: 10.0000 mg | ORAL_TABLET | Freq: Two times a day (BID) | ORAL | 0 refills | Status: DC | PRN

## 2023-02-28 NOTE — ED Triage Notes (Signed)
Pt presents to UC w/ c/o pain on left side/flank that goes to her back x2 weeks. No n/v/d. Denies falling or direct injury. Excedrin does not help

## 2023-02-28 NOTE — Discharge Instructions (Addendum)
You were given a Toradol injection in clinic today. Do not take any over the counter NSAID's such as Advil, ibuprofen, Aleve, or naproxen for 24 hours.  You may take tylenol if needed Start Flexeril as needed.  Please note this medication make you drowsy.  Do not drink alcohol or drive while you are on this medication. Start naproxen twice daily for 7 days tomorrow, 8/16. Heat to the back as needed as well as rest.  Please follow-up with your PCP if your symptoms do not improve.  Please go to the ER for any worsening symptoms.  I hope you feel better soon!

## 2023-02-28 NOTE — ED Provider Notes (Signed)
[facility]    CSN: [id number] Arrival date & time: 02/28/23  1405      History   Chief Complaint Chief Complaint  Patient presents with   Flank Pain    HPI Christine Riley is a 47 y.o. female presents for evaluation of back pain.  Patient reports 2 weeks of a persistent left-sided low back pain that radiates to her left side.  States is an aching type pain that is worse with movement and improves when she takes over-the-counter ibuprofen or Excedrin.  She denies any dysuria, hematuria, difficulty urinating, nausea/vomiting, abdominal pain, diarrhea, fevers or chills.  She is currently on her menstrual cycle.  Denies any history of kidney stones, pyelonephritis, low back pain/surgeries/fractures.  Denies any specific injury or known inciting event prior to onset of symptoms.  No other concerns at this time.   Flank Pain    History reviewed. No pertinent past medical history.  There are no problems to display for this patient.   History reviewed. No pertinent surgical history.  OB History   No obstetric history on file.      Home Medications    Prior to Admission medications   Medication Sig Start Date End Date Taking? Authorizing Provider  cyclobenzaprine (FLEXERIL) 10 MG tablet Take 1 tablet (10 mg total) by mouth 2 (two) times daily as needed for muscle spasms. 02/28/23  Yes Radford Pax, NP  naproxen (NAPROSYN) 500 MG tablet Take 1 tablet (500 mg total) by mouth 2 (two) times daily for 7 days. 03/01/23 03/08/23 Yes Radford Pax, NP  albuterol (VENTOLIN HFA) 108 (90 Base) MCG/ACT inhaler Inhale 1-2 puffs into the lungs every 6 (six) hours as needed for wheezing or shortness of breath. 10/26/22   Wallis Bamberg, PA-C  cetirizine (ZYRTEC ALLERGY) 10 MG tablet Take 1 tablet (10 mg total) by mouth daily. 10/26/22   Wallis Bamberg, PA-C  promethazine-dextromethorphan (PROMETHAZINE-DM) 6.25-15 MG/5ML syrup Take 5 mLs by mouth 3 (three) times daily as needed for cough.  10/26/22   Wallis Bamberg, PA-C    Family History History reviewed. No pertinent family history.  Social History Social History   Tobacco Use   Smoking status: Never   Smokeless tobacco: Never  Vaping Use   Vaping status: Never Used  Substance Use Topics   Alcohol use: Never   Drug use: Never     Allergies   Patient has no known allergies.   Review of Systems Review of Systems  Musculoskeletal:  Positive for back pain.     Physical Exam Triage Vital Signs ED Triage Vitals  Encounter Vitals Group     BP 02/28/23 1413 (!) 141/91     Systolic BP Percentile --      Diastolic BP Percentile --      Pulse Rate 02/28/23 1413 73     Resp 02/28/23 1413 16     Temp 02/28/23 1413 97.6 F (36.4 C)     Temp Source 02/28/23 1413 Oral     SpO2 02/28/23 1413 97 %     Weight --      Height --      Head Circumference --      Peak Flow --      Pain Score 02/28/23 1416 6     Pain Loc --      Pain Education --      Exclude from Growth Chart --    No data found.  Updated Vital Signs BP (!) 141/91 (BP  Location: Right Arm)   Pulse 73   Temp 97.6 F (36.4 C) (Oral)   Resp 16   LMP 02/25/2023 (Approximate)   SpO2 97%   Visual Acuity Right Eye Distance:   Left Eye Distance:   Bilateral Distance:    Right Eye Near:   Left Eye Near:    Bilateral Near:     Physical Exam Vitals and nursing note reviewed.  Constitutional:      General: She is not in acute distress.    Appearance: Normal appearance. She is not ill-appearing.  HENT:     Head: Normocephalic and atraumatic.  Eyes:     Pupils: Pupils are equal, round, and reactive to light.  Cardiovascular:     Rate and Rhythm: Normal rate.  Pulmonary:     Effort: Pulmonary effort is normal.  Abdominal:     Tenderness: There is left CVA tenderness. There is no right CVA tenderness.  Musculoskeletal:     Thoracic back: Spasms and tenderness present. No swelling, edema, deformity, signs of trauma, lacerations or bony  tenderness. Normal range of motion.     Lumbar back: Spasms and tenderness present. No swelling, edema, deformity, signs of trauma, lacerations or bony tenderness. Normal range of motion. Negative right straight leg raise test and negative left straight leg raise test. No scoliosis.       Back:     Comments: Strength 5 out of 5 bilateral lower extremities  Skin:    General: Skin is warm and dry.  Neurological:     General: No focal deficit present.     Mental Status: She is alert and oriented to person, place, and time.  Psychiatric:        Mood and Affect: Mood normal.        Behavior: Behavior normal.      UC Treatments / Results  Labs (all labs ordered are listed, but only abnormal results are displayed) Labs Reviewed  POCT URINALYSIS DIP (MANUAL ENTRY) - Abnormal; Notable for the following components:      Result Value   Color, UA light yellow (*)    Blood, UA large (*)    All other components within normal limits  URINE CULTURE    EKG   Radiology No results found.  Procedures Procedures (including critical care time)  Medications Ordered in UC Medications  ketorolac (TORADOL) 30 MG/ML injection 30 mg (30 mg Intramuscular Given 02/28/23 1444)    Initial Impression / Assessment and Plan / UC Course  I have reviewed the triage vital signs and the nursing notes.  Pertinent labs & imaging results that were available during my care of the patient were reviewed by me and considered in my medical decision making (see chart for details).     Reviewed exam and symptoms with patient.  UA with large blood without other signs of infection.  Patient is on her menses.  She is presenting with 2 weeks of a persistent left mid to lower back pain that radiates to her side.  No abdominal pain.  She does have some CVAT that does extend to her lower lumbar muscles.  Differentials include kidney stone, pyelonephritis, UTI, muscular skeletal pain.  Doubt kidney stone as blood is more  than likely from her menses.  No signs of a UTI or [place] on exam.  Will treat musculoskeletal he with close follow-up.  She was given a Toradol injection in clinic.  Monitored for 10 minutes after injection with no reaction noted and tolerated  well.  Reports improvement in back pain after Toradol.  She was instructed no NSAIDs for 24 hours and verbalized understanding.  Will do trial of Flexeril.  Side effect profile reviewed.  Will start naproxen twice daily tomorrow, 8/16.  Heat to the back as needed.  Advised PCP follow-up in 2 days for recheck or return to clinic if symptoms or not improving.  Strict ER precautions reviewed and patient verbalized understanding. Final Clinical Impressions(s) / UC Diagnoses   Final diagnoses:  Acute left-sided low back pain without sciatica  Hematuria, unspecified type     Discharge Instructions      You were given a Toradol injection in clinic today. Do not take any over the counter NSAID's such as Advil, ibuprofen, Aleve, or naproxen for 24 hours.  You may take tylenol if needed Start Flexeril as needed.  Please note this medication make you drowsy.  Do not drink alcohol or drive while you are on this medication. Start naproxen twice daily for 7 days tomorrow, 8/16. Heat to the back as needed as well as rest.  Please follow-up with your PCP if your symptoms do not improve.  Please go to the ER for any worsening symptoms.  I hope you feel better soon!      ED Prescriptions     Medication Sig Dispense Auth. Provider   cyclobenzaprine (FLEXERIL) 10 MG tablet Take 1 tablet (10 mg total) by mouth 2 (two) times daily as needed for muscle spasms. 10 tablet Radford Pax, NP   naproxen (NAPROSYN) 500 MG tablet Take 1 tablet (500 mg total) by mouth 2 (two) times daily for 7 days. 14 tablet Radford Pax, NP      PDMP not reviewed this encounter.   Radford Pax, NP 02/28/23 [phone]

## 2023-03-01 LAB — URINE CULTURE: Culture: NO GROWTH

## 2023-08-29 DIAGNOSIS — M25561 Pain in right knee: Secondary | ICD-10-CM | POA: Diagnosis not present

## 2023-11-20 DIAGNOSIS — M25461 Effusion, right knee: Secondary | ICD-10-CM | POA: Diagnosis not present

## 2023-12-15 ENCOUNTER — Ambulatory Visit
Admission: EM | Admit: 2023-12-15 | Discharge: 2023-12-15 | Disposition: A | Discharge status: Home / Self Care | Attending: Family Medicine | Admitting: Family Medicine

## 2023-12-15 ENCOUNTER — Encounter: Payer: Self-pay | Admitting: Emergency Medicine

## 2023-12-15 DIAGNOSIS — M62838 Other muscle spasm: Secondary | ICD-10-CM

## 2023-12-15 MED ORDER — CYCLOBENZAPRINE HCL 10 MG PO TABS: 10.0000 mg | ORAL_TABLET | Freq: Two times a day (BID) | ORAL | 0 refills | Status: AC | PRN

## 2023-12-15 MED ORDER — KETOROLAC TROMETHAMINE 30 MG/ML IJ SOLN
30.0000 mg | Freq: Once | INTRAMUSCULAR | Status: AC
  Administered 2023-12-15: 30 mg via INTRAMUSCULAR

## 2023-12-15 NOTE — ED Provider Notes (Signed)
 [facility]    CSN: [id number] Arrival date & time: 12/15/23  1337      History   Chief Complaint Chief Complaint  Patient presents with   Shoulder Pain    HPI DIAHANN GUAJARDO is a 48 y.o. female presents for neck and shoulder pain.  Patient reports yesterday she started to develop right sided neck pain that radiates down into her posterior shoulder to her lateral shoulder and around to her collarbone.  Denies any known injury or inciting event.  Denies any numbness/tingling/weakness of her upper extremities.  No headaches.  No history of injuries or surgeries to the affected areas.  She has taken Tylenol  without any improvement.  No other concerns at this time.   Shoulder Pain   History reviewed. No pertinent past medical history.  There are no active problems to display for this patient.   History reviewed. No pertinent surgical history.  OB History   No obstetric history on file.      Home Medications    Prior to Admission medications   Medication Sig Start Date End Date Taking? Authorizing Provider  cyclobenzaprine  (FLEXERIL ) 10 MG tablet Take 1 tablet (10 mg total) by mouth 2 (two) times daily as needed for muscle spasms. 12/15/23  Yes Gery Sabedra, Jodi R, NP  albuterol  (VENTOLIN  HFA) 108 (90 Base) MCG/ACT inhaler Inhale 1-2 puffs into the lungs every 6 (six) hours as needed for wheezing or shortness of breath. 10/26/22   Adolph Hoop, PA-C  cetirizine  (ZYRTEC  ALLERGY) 10 MG tablet Take 1 tablet (10 mg total) by mouth daily. 10/26/22   Adolph Hoop, PA-C  promethazine -dextromethorphan (PROMETHAZINE -DM) 6.25-15 MG/5ML syrup Take 5 mLs by mouth 3 (three) times daily as needed for cough. 10/26/22   Adolph Hoop, PA-C    Family History History reviewed. No pertinent family history.  Social History Social History   Tobacco Use   Smoking status: Never   Smokeless tobacco: Never  Vaping Use   Vaping status: Never Used  Substance Use Topics   Alcohol use: Never    Drug use: Never     Allergies   Patient has no known allergies.   Review of Systems Review of Systems  Musculoskeletal:        Right neck and shoulder pain     Physical Exam Triage Vital Signs ED Triage Vitals  Encounter Vitals Group     BP 12/15/23 1414 (!) 147/84     Systolic BP Percentile --      Diastolic BP Percentile --      Pulse Rate 12/15/23 1414 91     Resp 12/15/23 1414 18     Temp 12/15/23 1414 98.4 F (36.9 C)     Temp Source 12/15/23 1414 Oral     SpO2 12/15/23 1414 96 %     Weight --      Height --      Head Circumference --      Peak Flow --      Pain Score 12/15/23 1416 10     Pain Loc --      Pain Education --      Exclude from Growth Chart --    No data found.  Updated Vital Signs BP (!) 147/84 (BP Location: Right Arm)   Pulse 91   Temp 98.4 F (36.9 C) (Oral)   Resp 18   LMP 11/14/2023   SpO2 96%   Visual Acuity Right Eye Distance:   Left Eye Distance:   Bilateral  Distance:    Right Eye Near:   Left Eye Near:    Bilateral Near:     Physical Exam Vitals and nursing note reviewed.  Constitutional:      General: She is not in acute distress.    Appearance: Normal appearance. She is not ill-appearing.  HENT:     Head: Normocephalic and atraumatic.  Eyes:     Pupils: Pupils are equal, round, and reactive to light.  Cardiovascular:     Rate and Rhythm: Normal rate.  Pulmonary:     Effort: Pulmonary effort is normal.  Musculoskeletal:       Arms:     Comments: There is no swelling, ecchymosis, erythema of the neck or right shoulder.  There is no spinal tenderness from cervical to thoracic spine.  There is right-sided paracervical muscle tenderness that extends to right trapezius/rhomboid as well as to the lateral shoulder/deltoid.  Full range of motion of neck and shoulder with minimal pain.  Strength is 5 out of 5 bilateral upper extremities.  Skin:    General: Skin is warm and dry.  Neurological:     General: No focal deficit  present.     Mental Status: She is alert and oriented to person, place, and time.  Psychiatric:        Mood and Affect: Mood normal.        Behavior: Behavior normal.      UC Treatments / Results  Labs (all labs ordered are listed, but only abnormal results are displayed) Labs Reviewed - No data to display  EKG   Radiology No results found.  Procedures Procedures (including critical care time)  Medications Ordered in UC Medications  ketorolac  (TORADOL ) 30 MG/ML injection 30 mg (has no administration in time range)    Initial Impression / Assessment and Plan / UC Course  I have reviewed the triage vital signs and the nursing notes.  Pertinent labs & imaging results that were available during my care of the patient were reviewed by me and considered in my medical decision making (see chart for details).     Reviewed exam and symptoms with patient.  Discussed musculoskeletal cause of symptoms.  Patient was given Toradol  injection in clinic.  She was monitored for 10 minutes after injection with no reaction noted and tolerated well.  She was instructed no NSAIDs for 24 hours and verbalized understanding.  Will do trial of Flexeril , side effect profile reviewed.  Discussed heat rest and PCP follow-up if symptoms do not improve.  ER precautions reviewed. Final Clinical Impressions(s) / UC Diagnoses   Final diagnoses:  Muscle spasm of shoulder region     Discharge Instructions      You were given a Toradol  injection in clinic today. Do not take any over the counter NSAID's such as Advil, ibuprofen, Aleve , or naproxen  for 24 hours. You may take tylenol  if needed.  You may start Flexeril  twice daily as needed.  Please note this medication will make you drowsy.  Do not drink alcohol or drive while on this medication.  Use heat to the neck and shoulder as needed.  Lots of rest.  Please follow-up with your PCP if your symptoms do not improve.  Please go to the ER for any worsening  symptoms.  Hope you feel better soon!    ED Prescriptions     Medication Sig Dispense Auth. Provider   cyclobenzaprine  (FLEXERIL ) 10 MG tablet Take 1 tablet (10 mg total) by mouth 2 (two) times  daily as needed for muscle spasms. 10 tablet Lawernce Earll, Jodi R, NP      PDMP not reviewed this encounter.   Alleen Arbour, NP 12/15/23 1438

## 2023-12-15 NOTE — ED Triage Notes (Signed)
 Patient c/o right sided neck pain that radiates down to her right shoulder and chest since yesterday.  No apparent injury. Patient did take Tylenol  last night for pain.

## 2023-12-15 NOTE — Discharge Instructions (Signed)
 You were given a Toradol  injection in clinic today. Do not take any over the counter NSAID's such as Advil, ibuprofen, Aleve , or naproxen  for 24 hours. You may take tylenol  if needed.  You may start Flexeril  twice daily as needed.  Please note this medication will make you drowsy.  Do not drink alcohol or drive while on this medication.  Use heat to the neck and shoulder as needed.  Lots of rest.  Please follow-up with your PCP if your symptoms do not improve.  Please go to the ER for any worsening symptoms.  Hope you feel better soon!

## 2023-12-16 ENCOUNTER — Ambulatory Visit
Admission: EM | Admit: 2023-12-16 | Discharge: 2023-12-16 | Disposition: A | Discharge status: Home / Self Care | Attending: Family Medicine | Admitting: Family Medicine

## 2023-12-16 DIAGNOSIS — G44209 Tension-type headache, unspecified, not intractable: Secondary | ICD-10-CM

## 2023-12-16 DIAGNOSIS — M5412 Radiculopathy, cervical region: Secondary | ICD-10-CM

## 2023-12-16 MED ORDER — METHYLPREDNISOLONE ACETATE 40 MG/ML IJ SUSP
40.0000 mg | Freq: Once | INTRAMUSCULAR | Status: AC
  Administered 2023-12-16: 40 mg via INTRAMUSCULAR

## 2023-12-16 NOTE — ED Triage Notes (Signed)
 Pt c/o HA, bilat shoulder pain started 2 days ago-seen here for same yesterday-states pain is worse-NAD-steady gait

## 2023-12-16 NOTE — ED Provider Notes (Signed)
 [facility]  Note:  This document was prepared using Conservation officer, historic buildings and may include unintentional dictation errors.  MRN: [id number] DOB: [birth year]  Subjective:   Christine Riley is a 48 y.o. female presenting for 2-day history of acute onset persistent moderate to severe pain about the neck and trapezius area.  Symptoms are radiating to either the left upper arm or the right upper arm.  She is also reporting that the pain radiates from her neck to the front part of her forehead.  She was seen yesterday and started on a muscle relaxer, received a Toradol  injection.  She had very transient relief.  Has a history of neck pains but has never gotten to this point.  I had seen patient previously for this and x-ray was completely normal in 2022.  No fall, trauma.  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   Current Facility-Administered Medications:    methylPREDNISolone  acetate (DEPO-MEDROL ) injection 40 mg, 40 mg, Intramuscular, Once, Adolph Hoop, PA-C  Current Outpatient Medications:    albuterol  (VENTOLIN  HFA) 108 (90 Base) MCG/ACT inhaler, Inhale 1-2 puffs into the lungs every 6 (six) hours as needed for wheezing or shortness of breath., Disp: 18 g, Rfl: 0   cetirizine  (ZYRTEC  ALLERGY) 10 MG tablet, Take 1 tablet (10 mg total) by mouth daily., Disp: 90 tablet, Rfl: 0   cyclobenzaprine  (FLEXERIL ) 10 MG tablet, Take 1 tablet (10 mg total) by mouth 2 (two) times daily as needed for muscle spasms., Disp: 10 tablet, Rfl: 0   promethazine -dextromethorphan (PROMETHAZINE -DM) 6.25-15 MG/5ML syrup, Take 5 mLs by mouth 3 (three) times daily as needed for cough., Disp: 200 mL, Rfl: 0   No Known Allergies  History reviewed. No pertinent past medical history.   History reviewed. No pertinent surgical history.  No family history on file.  Social History   Tobacco Use   Smoking status: Never   Smokeless tobacco: Never   Vaping Use   Vaping status: Never Used  Substance Use Topics   Alcohol use: Never   Drug use: Never    ROS   Objective:   Vitals: BP (!) 148/91 (BP Location: Left Arm)   Pulse 95   Temp 98.4 F (36.9 C) (Oral)   Resp 20   LMP 11/27/2023 (Approximate)   SpO2 96%   Physical Exam Constitutional:      General: She is not in acute distress.    Appearance: Normal appearance. She is well-developed. She is not ill-appearing, toxic-appearing or diaphoretic.  HENT:     Head: Normocephalic and atraumatic.     Right Ear: External ear normal.     Left Ear: External ear normal.     Nose: Nose normal.     Mouth/Throat:     Mouth: Mucous membranes are moist.  Eyes:     General: No scleral icterus.       Right eye: No discharge.        Left eye: No discharge.     Extraocular Movements: Extraocular movements intact.  Cardiovascular:     Rate and Rhythm: Normal rate.  Pulmonary:     Effort: Pulmonary effort is normal.  Musculoskeletal:     Cervical back: Neck supple. Spasms and tenderness present. No swelling, edema, deformity, erythema, signs of trauma, lacerations, rigidity, torticollis, bony tenderness or crepitus. Pain with movement (+Spurling maneuver to the right) present. No spinous process tenderness or muscular tenderness. Decreased  range of motion.  Skin:    General: Skin is warm and dry.  Neurological:     General: No focal deficit present.     Mental Status: She is alert and oriented to person, place, and time.     Cranial Nerves: No cranial nerve deficit, dysarthria or facial asymmetry.     Sensory: No sensory deficit.     Motor: No weakness.     Coordination: Coordination normal.     Gait: Gait normal.     Deep Tendon Reflexes: Reflexes normal.  Psychiatric:        Mood and Affect: Mood normal.        Speech: Speech normal.        Behavior: Behavior normal.    IM Depo-Medrol  40 mg administered in clinic.  Assessment and Plan :   PDMP not reviewed this  encounter.  1. Cervical radiculopathy   2. Tension headache    No signs of an acute encephalopathy.  Emphasized need for follow-up with [facility].  High suspicion for cervical radiculopathy.  Therefore IM steroids were appropriate.  Continue with the muscle relaxant.  Discussed neck care.  Counseled patient on potential for adverse effects with medications prescribed/recommended today, ER and return-to-clinic precautions discussed, patient verbalized understanding.    Adolph Hoop, [place] 12/16/23 [id number]
